# Patient Record
Sex: Male | Born: 1985 | Race: Black or African American | Hispanic: No | Marital: Single | Smoking: Current every day smoker
Health system: Southern US, Community
[De-identification: ages and names within clinical notes are randomized; demographics above are authoritative.]

---

## 2016-08-19 DIAGNOSIS — S0083XA Contusion of other part of head, initial encounter: Secondary | ICD-10-CM | POA: Insufficient documentation

## 2016-08-19 DIAGNOSIS — F17210 Nicotine dependence, cigarettes, uncomplicated: Secondary | ICD-10-CM | POA: Diagnosis not present

## 2016-08-19 DIAGNOSIS — S20211A Contusion of right front wall of thorax, initial encounter: Secondary | ICD-10-CM | POA: Diagnosis not present

## 2016-08-19 DIAGNOSIS — Y9389 Activity, other specified: Secondary | ICD-10-CM | POA: Insufficient documentation

## 2016-08-19 DIAGNOSIS — S299XXA Unspecified injury of thorax, initial encounter: Secondary | ICD-10-CM | POA: Diagnosis present

## 2016-08-19 DIAGNOSIS — Y929 Unspecified place or not applicable: Secondary | ICD-10-CM | POA: Insufficient documentation

## 2016-08-19 DIAGNOSIS — Y999 Unspecified external cause status: Secondary | ICD-10-CM | POA: Diagnosis not present

## 2016-08-20 ENCOUNTER — Emergency Department
Admission: EM | Admit: 2016-08-20 | Discharge: 2016-08-20 | Disposition: A | Discharge status: Home / Self Care | Attending: Emergency Medicine | Admitting: Emergency Medicine

## 2016-08-20 ENCOUNTER — Encounter: Payer: Self-pay | Admitting: Emergency Medicine

## 2016-08-20 ENCOUNTER — Emergency Department

## 2016-08-20 DIAGNOSIS — S20211A Contusion of right front wall of thorax, initial encounter: Secondary | ICD-10-CM

## 2016-08-20 DIAGNOSIS — S0083XA Contusion of other part of head, initial encounter: Secondary | ICD-10-CM

## 2016-08-20 MED ORDER — IBUPROFEN 800 MG PO TABS: 800.0000 mg | ORAL_TABLET | Freq: Three times a day (TID) | ORAL | 0 refills | Status: AC | PRN

## 2016-08-20 MED ORDER — OXYCODONE-ACETAMINOPHEN 5-325 MG PO TABS
1.0000 | ORAL_TABLET | Freq: Once | ORAL | Status: AC
  Administered 2016-08-20: 1 via ORAL
  Filled 2016-08-20: qty 1
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
